# Patient Record
Sex: Female | Born: 1970
Health system: Southern US, Community
[De-identification: ages and names within clinical notes are randomized; demographics above are authoritative.]

## PROBLEM LIST (undated history)

## (undated) DIAGNOSIS — F3281 Premenstrual dysphoric disorder: Secondary | ICD-10-CM

## (undated) DIAGNOSIS — R922 Inconclusive mammogram: Secondary | ICD-10-CM

## (undated) DIAGNOSIS — R9230 Dense breasts, unspecified: Secondary | ICD-10-CM

## (undated) HISTORY — DX: Dense breasts, unspecified: R92.30

## (undated) HISTORY — DX: Premenstrual dysphoric disorder: F32.81

## (undated) HISTORY — DX: Inconclusive mammogram: R92.2

---

## 1999-06-15 ENCOUNTER — Other Ambulatory Visit: Admission: RE | Admit: 1999-06-15 | Discharge: 1999-06-15 | Payer: Self-pay | Admitting: Obstetrics & Gynecology

## 2000-01-21 ENCOUNTER — Inpatient Hospital Stay (HOSPITAL_COMMUNITY): Admission: AD | Admit: 2000-01-21 | Discharge: 2000-01-26 | Payer: Self-pay | Admitting: Obstetrics and Gynecology

## 2000-02-10 ENCOUNTER — Encounter: Admission: RE | Admit: 2000-02-10 | Discharge: 2000-02-14 | Payer: Self-pay | Admitting: Obstetrics and Gynecology

## 2000-07-31 ENCOUNTER — Other Ambulatory Visit: Admission: RE | Admit: 2000-07-31 | Discharge: 2000-07-31 | Payer: Self-pay | Admitting: Obstetrics and Gynecology

## 2000-09-20 ENCOUNTER — Emergency Department (HOSPITAL_COMMUNITY): Admission: EM | Admit: 2000-09-20 | Discharge: 2000-09-21 | Payer: Self-pay | Admitting: *Deleted

## 2001-12-02 ENCOUNTER — Other Ambulatory Visit: Admission: RE | Admit: 2001-12-02 | Discharge: 2001-12-02 | Payer: Self-pay | Admitting: Obstetrics and Gynecology

## 2002-07-06 ENCOUNTER — Inpatient Hospital Stay (HOSPITAL_COMMUNITY): Admission: AD | Admit: 2002-07-06 | Discharge: 2002-07-10 | Payer: Self-pay | Admitting: Obstetrics and Gynecology

## 2002-12-07 ENCOUNTER — Other Ambulatory Visit: Admission: RE | Admit: 2002-12-07 | Discharge: 2002-12-07 | Payer: Self-pay | Admitting: Obstetrics and Gynecology

## 2003-12-28 ENCOUNTER — Other Ambulatory Visit: Admission: RE | Admit: 2003-12-28 | Discharge: 2003-12-28 | Payer: Self-pay | Admitting: Obstetrics and Gynecology

## 2004-11-11 ENCOUNTER — Emergency Department (HOSPITAL_COMMUNITY): Admission: EM | Admit: 2004-11-11 | Discharge: 2004-11-11 | Payer: Self-pay | Admitting: Family Medicine

## 2005-01-30 ENCOUNTER — Other Ambulatory Visit: Admission: RE | Admit: 2005-01-30 | Discharge: 2005-01-30 | Payer: Self-pay | Admitting: Obstetrics and Gynecology

## 2010-02-03 ENCOUNTER — Ambulatory Visit (HOSPITAL_COMMUNITY)
Admission: RE | Admit: 2010-02-03 | Discharge: 2010-02-03 | Payer: Self-pay | Source: Home / Self Care | Attending: Obstetrics and Gynecology | Admitting: Obstetrics and Gynecology

## 2011-03-01 ENCOUNTER — Other Ambulatory Visit: Payer: Self-pay | Admitting: Obstetrics and Gynecology

## 2011-03-01 DIAGNOSIS — Z1231 Encounter for screening mammogram for malignant neoplasm of breast: Secondary | ICD-10-CM

## 2011-03-26 ENCOUNTER — Ambulatory Visit (HOSPITAL_COMMUNITY)
Admission: RE | Admit: 2011-03-26 | Discharge: 2011-03-26 | Disposition: A | Discharge status: Home / Self Care | Payer: 59 | Source: Ambulatory Visit | Attending: Obstetrics and Gynecology | Admitting: Obstetrics and Gynecology

## 2011-03-26 DIAGNOSIS — Z1231 Encounter for screening mammogram for malignant neoplasm of breast: Secondary | ICD-10-CM | POA: Insufficient documentation

## 2011-03-27 ENCOUNTER — Ambulatory Visit: Payer: Self-pay | Admitting: Obstetrics and Gynecology

## 2011-03-29 ENCOUNTER — Ambulatory Visit: Payer: 59 | Admitting: Obstetrics and Gynecology

## 2011-04-10 ENCOUNTER — Other Ambulatory Visit: Payer: Self-pay | Admitting: Obstetrics and Gynecology

## 2011-04-10 DIAGNOSIS — R922 Inconclusive mammogram: Secondary | ICD-10-CM

## 2011-04-12 ENCOUNTER — Other Ambulatory Visit: Payer: 59

## 2011-04-25 ENCOUNTER — Other Ambulatory Visit: Payer: Self-pay | Admitting: Obstetrics and Gynecology

## 2011-04-25 ENCOUNTER — Ambulatory Visit
Admission: RE | Admit: 2011-04-25 | Discharge: 2011-04-25 | Disposition: A | Discharge status: Home / Self Care | Payer: 59 | Source: Ambulatory Visit | Attending: Obstetrics and Gynecology | Admitting: Obstetrics and Gynecology

## 2011-04-25 DIAGNOSIS — R922 Inconclusive mammogram: Secondary | ICD-10-CM

## 2011-05-09 ENCOUNTER — Ambulatory Visit
Admission: RE | Admit: 2011-05-09 | Discharge: 2011-05-09 | Disposition: A | Discharge status: Home / Self Care | Payer: 59 | Source: Ambulatory Visit | Attending: Obstetrics and Gynecology | Admitting: Obstetrics and Gynecology

## 2011-05-09 ENCOUNTER — Inpatient Hospital Stay: Admission: RE | Admit: 2011-05-09 | Payer: 59 | Source: Ambulatory Visit

## 2011-05-09 ENCOUNTER — Other Ambulatory Visit: Payer: Self-pay | Admitting: Obstetrics and Gynecology

## 2011-05-09 DIAGNOSIS — R922 Inconclusive mammogram: Secondary | ICD-10-CM

## 2011-05-09 HISTORY — PX: BREAST BIOPSY: SHX20

## 2012-03-05 ENCOUNTER — Other Ambulatory Visit: Payer: Self-pay

## 2012-03-05 DIAGNOSIS — Z1231 Encounter for screening mammogram for malignant neoplasm of breast: Secondary | ICD-10-CM

## 2012-03-17 ENCOUNTER — Other Ambulatory Visit (HOSPITAL_COMMUNITY): Payer: Self-pay | Admitting: Obstetrics and Gynecology

## 2012-03-18 ENCOUNTER — Telehealth: Payer: Self-pay | Admitting: Obstetrics and Gynecology

## 2012-03-18 MED ORDER — DESOGESTREL-ETHINYL ESTRADIOL 0.15-30 MG-MCG PO TABS: 1.0000 | ORAL_TABLET | Freq: Every day | ORAL | Status: DC

## 2012-03-18 NOTE — Telephone Encounter (Signed)
VM from pt. requesting RF OCP. Has scheduled appt. [facility]. Pt [id number]

## 2012-03-18 NOTE — Telephone Encounter (Signed)
Pt scheduled for apt with [facility] 03/20/2012

## 2012-03-20 ENCOUNTER — Encounter: Payer: Self-pay | Admitting: Obstetrics and Gynecology

## 2012-03-20 ENCOUNTER — Ambulatory Visit: Payer: 59 | Admitting: Obstetrics and Gynecology

## 2012-03-20 VITALS — BP 114/80 | HR 82 | Ht 68.0 in | Wt 162.0 lb

## 2012-03-20 DIAGNOSIS — F3281 Premenstrual dysphoric disorder: Secondary | ICD-10-CM | POA: Insufficient documentation

## 2012-03-20 DIAGNOSIS — N912 Amenorrhea, unspecified: Secondary | ICD-10-CM

## 2012-03-20 DIAGNOSIS — R922 Inconclusive mammogram: Secondary | ICD-10-CM

## 2012-03-20 DIAGNOSIS — Z01419 Encounter for gynecological examination (general) (routine) without abnormal findings: Secondary | ICD-10-CM

## 2012-03-20 LAB — POCT URINE PREGNANCY: Preg Test, Ur: NEGATIVE

## 2012-03-20 MED ORDER — BUPROPION HCL ER (XL) 150 MG PO TB24: 150.0000 mg | ORAL_TABLET | Freq: Two times a day (BID) | ORAL | Status: AC

## 2012-03-20 MED ORDER — DESOGESTREL-ETHINYL ESTRADIOL 0.15-30 MG-MCG PO TABS: 1.0000 | ORAL_TABLET | Freq: Every day | ORAL | Status: AC

## 2012-03-20 NOTE — Progress Notes (Signed)
Subjective:  Last Pap: 03/20/10, next pap due 2015 per last note WNL: Yes Regular Periods:no Contraception: pill   Monthly Breast exam:no Tetanus<49yrs:yes Nl.Bladder Function:yes Daily BMs:no Healthy Diet:no Calcium:no Mammogram:yes Date of Mammogram: 02/2011, pt is scheduled for 04/16/12 Exercise:no Have often Exercise: n/a Seatbelt: yes Abuse at home: no Stressful work:yes Sigmoid-colonoscopy: n/a Bone Density: No PCP: Dr. Gweneth Dimitri Change in PMH: no changes  Change in FMH:no changes    Brooke Jackson is a 42 y.o. female, G2P1, who presents for an annual exam.     History   Social History  . Marital Status: Married    Spouse Name: N/A    Number of Children: N/A  . Years of Education: N/A   Social History Main Topics  . Smoking status: Never Smoker   . Smokeless tobacco: None  . Alcohol Use: No  . Drug Use: No  . Sexually Active: Yes    Birth Control/ Protection: Pill     Comment: reclipsen   Other Topics Concern  . None   Social History Narrative  . None    Menstrual cycle:   LMP: No LMP recorded. Patient is not currently having periods (Reason: Oral contraceptives).           Cycle: Improved headaches since no menses, even on 3 days of no bcps every 3 mos.  Still gets good relief of mood sx with Wellbutrin.  The following portions of the patient's history were reviewed and updated as appropriate: allergies, current medications, past family history, past medical history, past social history, past surgical history and problem list.  Review of Systems Pertinent items are noted in HPI. Breast:Negative for breast lump,nipple discharge or nipple retraction Gastrointestinal: Negative for abdominal pain, change in bowel habits or rectal bleeding Urinary:negative   Objective:    BP 114/80  Pulse 82  Ht 5\' 8"  (1.727 m)  Wt 162 lb (73.483 kg)  BMI 24.64 kg/m2    Weight:  Wt Readings from Last 1 Encounters:  03/20/12 162 lb (73.483 kg)          BMI: Body  mass index is 24.64 kg/(m^2).  General Appearance: Alert, appropriate appearance for age. No acute distress HEENT: Grossly normal Neck / Thyroid: Supple, no masses, nodes or enlargement Lungs: clear to auscultation bilaterally Back: No CVA tenderness Breast Exam: No masses or nodes.No dimpling, nipple retraction or discharge. Cardiovascular: Regular rate and rhythm. S1, S2, no murmur Gastrointestinal: Soft, non-tender, no masses or organomegaly Pelvic Exam: Vulva and vagina appear normal. Bimanual exam reveals normal uterus and adnexa. Rectovaginal: normal rectal, no masses Lymphatic Exam: Non-palpable nodes in neck, clavicular, axillary, or inguinal regions Skin: no rash or abnormalities Neurologic: Normal gait and speech, no tremor  Psychiatric: Alert and oriented, appropriate affect.   Wet Prep:not applicable Urinalysis:not applicable UPT: Not done   Assessment:    Normal gyn exam  Good relief from extended cycle BCPs Dense breases   Plan:    Mammogram 3D pap smear done, next pap due 2017 return annually or prn Contraception:oral contraceptives (estrogen/progesterone) extended cycle Continue wellbutrin per pt request  Dierdre Forth  MD

## 2012-04-14 ENCOUNTER — Encounter: Payer: Self-pay | Admitting: Obstetrics and Gynecology

## 2012-04-14 ENCOUNTER — Ambulatory Visit
Admission: RE | Admit: 2012-04-14 | Discharge: 2012-04-14 | Disposition: A | Discharge status: Home / Self Care | Payer: 59 | Source: Ambulatory Visit

## 2012-04-14 DIAGNOSIS — Z1231 Encounter for screening mammogram for malignant neoplasm of breast: Secondary | ICD-10-CM

## 2012-11-13 ENCOUNTER — Other Ambulatory Visit: Payer: Self-pay

## 2013-03-20 ENCOUNTER — Other Ambulatory Visit: Payer: Self-pay

## 2013-03-20 DIAGNOSIS — Z1231 Encounter for screening mammogram for malignant neoplasm of breast: Secondary | ICD-10-CM

## 2013-04-21 ENCOUNTER — Ambulatory Visit
Admission: RE | Admit: 2013-04-21 | Discharge: 2013-04-21 | Disposition: A | Discharge status: Home / Self Care | Payer: 59 | Source: Ambulatory Visit

## 2013-04-21 DIAGNOSIS — Z1231 Encounter for screening mammogram for malignant neoplasm of breast: Secondary | ICD-10-CM

## 2013-04-23 ENCOUNTER — Other Ambulatory Visit: Payer: Self-pay | Admitting: Obstetrics and Gynecology

## 2013-04-23 DIAGNOSIS — R928 Other abnormal and inconclusive findings on diagnostic imaging of breast: Secondary | ICD-10-CM

## 2013-05-05 ENCOUNTER — Ambulatory Visit
Admission: RE | Admit: 2013-05-05 | Discharge: 2013-05-05 | Disposition: A | Discharge status: Home / Self Care | Payer: 59 | Source: Ambulatory Visit | Attending: Obstetrics and Gynecology | Admitting: Obstetrics and Gynecology

## 2013-05-05 DIAGNOSIS — R928 Other abnormal and inconclusive findings on diagnostic imaging of breast: Secondary | ICD-10-CM

## 2013-10-05 ENCOUNTER — Other Ambulatory Visit: Payer: Self-pay | Admitting: Obstetrics and Gynecology

## 2013-10-05 DIAGNOSIS — N63 Unspecified lump in unspecified breast: Secondary | ICD-10-CM

## 2013-10-23 ENCOUNTER — Other Ambulatory Visit: Payer: Self-pay

## 2013-10-26 ENCOUNTER — Ambulatory Visit
Admission: RE | Admit: 2013-10-26 | Discharge: 2013-10-26 | Disposition: A | Discharge status: Home / Self Care | Payer: 59 | Source: Ambulatory Visit | Attending: Obstetrics and Gynecology | Admitting: Obstetrics and Gynecology

## 2013-10-26 DIAGNOSIS — N63 Unspecified lump in unspecified breast: Secondary | ICD-10-CM

## 2013-11-09 ENCOUNTER — Encounter: Payer: Self-pay | Admitting: Obstetrics and Gynecology

## 2014-03-29 ENCOUNTER — Other Ambulatory Visit: Payer: Self-pay

## 2014-03-29 DIAGNOSIS — Z1231 Encounter for screening mammogram for malignant neoplasm of breast: Secondary | ICD-10-CM

## 2014-05-03 ENCOUNTER — Ambulatory Visit
Admission: RE | Admit: 2014-05-03 | Discharge: 2014-05-03 | Disposition: A | Discharge status: Home / Self Care | Payer: 59 | Source: Ambulatory Visit

## 2014-05-03 DIAGNOSIS — Z1231 Encounter for screening mammogram for malignant neoplasm of breast: Secondary | ICD-10-CM

## 2015-02-18 MED FILL — BUPROPION HCL XL 150 MG TAB: 150 | 90 days supply | Qty: 180 | Fill #3

## 2015-03-08 MED FILL — EMOQUETTE 28 DAY TABLET: 0.15-30 | 84 days supply | Qty: 112 | Fill #3

## 2015-03-31 ENCOUNTER — Other Ambulatory Visit: Payer: Self-pay

## 2015-03-31 DIAGNOSIS — Z3040 Encounter for surveillance of contraceptives, unspecified: Secondary | ICD-10-CM | POA: Diagnosis not present

## 2015-03-31 DIAGNOSIS — Z6829 Body mass index (BMI) 29.0-29.9, adult: Secondary | ICD-10-CM | POA: Diagnosis not present

## 2015-03-31 DIAGNOSIS — Z01419 Encounter for gynecological examination (general) (routine) without abnormal findings: Secondary | ICD-10-CM | POA: Diagnosis not present

## 2015-03-31 DIAGNOSIS — N943 Premenstrual tension syndrome: Secondary | ICD-10-CM | POA: Diagnosis not present

## 2015-03-31 DIAGNOSIS — Z1231 Encounter for screening mammogram for malignant neoplasm of breast: Secondary | ICD-10-CM

## 2015-04-14 DIAGNOSIS — N943 Premenstrual tension syndrome: Secondary | ICD-10-CM | POA: Diagnosis not present

## 2015-04-14 DIAGNOSIS — Z13220 Encounter for screening for lipoid disorders: Secondary | ICD-10-CM | POA: Diagnosis not present

## 2015-04-14 DIAGNOSIS — R51 Headache: Secondary | ICD-10-CM | POA: Diagnosis not present

## 2015-04-14 DIAGNOSIS — R1013 Epigastric pain: Secondary | ICD-10-CM | POA: Diagnosis not present

## 2015-04-14 DIAGNOSIS — Z0000 Encounter for general adult medical examination without abnormal findings: Secondary | ICD-10-CM | POA: Diagnosis not present

## 2015-05-12 ENCOUNTER — Ambulatory Visit
Admission: RE | Admit: 2015-05-12 | Discharge: 2015-05-12 | Disposition: A | Discharge status: Home / Self Care | Payer: 59 | Source: Ambulatory Visit

## 2015-05-12 DIAGNOSIS — Z1231 Encounter for screening mammogram for malignant neoplasm of breast: Secondary | ICD-10-CM

## 2015-06-01 MED FILL — JULEBER 0.15-30 MG-MCG TABS: 0.15-30 | 84 days supply | Qty: 112 | Fill #0

## 2015-07-18 MED FILL — BUPROPION HCL XL 150 MG TAB: 150 | 90 days supply | Qty: 180 | Fill #0

## 2015-08-19 MED FILL — EMOQUETTE 28 DAY TABLET: 0.15-30 | 84 days supply | Qty: 112 | Fill #1

## 2015-10-31 MED FILL — BUPROPION HCL XL 150 MG TAB: 150 | 90 days supply | Qty: 180 | Fill #1

## 2015-11-22 MED FILL — EMOQUETTE 28 DAY TABLET: 0.15-30 | 84 days supply | Qty: 112 | Fill #2

## 2016-02-07 MED FILL — BUPROPION HCL XL 150 MG TAB: 150 | 90 days supply | Qty: 180 | Fill #2

## 2016-02-07 MED FILL — EMOQUETTE 28 DAY TABLET: 0.15-30 | 84 days supply | Qty: 112 | Fill #3

## 2016-04-17 DIAGNOSIS — H905 Unspecified sensorineural hearing loss: Secondary | ICD-10-CM | POA: Diagnosis not present

## 2016-04-17 DIAGNOSIS — Z0000 Encounter for general adult medical examination without abnormal findings: Secondary | ICD-10-CM | POA: Diagnosis not present

## 2016-04-17 DIAGNOSIS — N943 Premenstrual tension syndrome: Secondary | ICD-10-CM | POA: Diagnosis not present

## 2016-04-17 DIAGNOSIS — R51 Headache: Secondary | ICD-10-CM | POA: Diagnosis not present

## 2016-04-17 DIAGNOSIS — R350 Frequency of micturition: Secondary | ICD-10-CM | POA: Diagnosis not present

## 2016-05-10 DIAGNOSIS — Z1231 Encounter for screening mammogram for malignant neoplasm of breast: Secondary | ICD-10-CM | POA: Diagnosis not present

## 2016-05-10 DIAGNOSIS — Z01419 Encounter for gynecological examination (general) (routine) without abnormal findings: Secondary | ICD-10-CM | POA: Diagnosis not present

## 2016-05-10 DIAGNOSIS — J343 Hypertrophy of nasal turbinates: Secondary | ICD-10-CM | POA: Diagnosis not present

## 2016-05-10 DIAGNOSIS — H9313 Tinnitus, bilateral: Secondary | ICD-10-CM | POA: Diagnosis not present

## 2016-05-10 DIAGNOSIS — N943 Premenstrual tension syndrome: Secondary | ICD-10-CM | POA: Diagnosis not present

## 2016-05-10 DIAGNOSIS — H8110 Benign paroxysmal vertigo, unspecified ear: Secondary | ICD-10-CM | POA: Diagnosis not present

## 2016-05-10 DIAGNOSIS — Z6825 Body mass index (BMI) 25.0-25.9, adult: Secondary | ICD-10-CM | POA: Diagnosis not present

## 2016-05-10 DIAGNOSIS — H903 Sensorineural hearing loss, bilateral: Secondary | ICD-10-CM | POA: Diagnosis not present

## 2016-05-10 MED FILL — BUPROPION HCL XL 150 MG TAB: 150 | 90 days supply | Qty: 180 | Fill #0

## 2016-05-10 MED FILL — EMOQUETTE 28 DAY TABLET: 0.15-30 | 84 days supply | Qty: 112 | Fill #0

## 2016-07-06 MED FILL — CIPROFLOXACIN HCL 500 MG TA: 500 | 7 days supply | Qty: 14 | Fill #0

## 2016-08-07 MED FILL — BUPROPION HCL XL 150 MG TAB: 150 | 90 days supply | Qty: 180 | Fill #1

## 2016-08-07 MED FILL — JULEBER 0.15-30 MG-MCG TABS: 0.15-30 | 84 days supply | Qty: 112 | Fill #1

## 2016-11-02 MED FILL — BUPROPION HCL XL 150 MG TAB: 150 | 90 days supply | Qty: 180 | Fill #2

## 2016-11-02 MED FILL — JULEBER 0.15-30 MG-MCG TABS: 0.15-30 | 84 days supply | Qty: 112 | Fill #2

## 2017-02-04 MED FILL — JULEBER 0.15-30 MG-MCG TABS: 0.15-30 | 84 days supply | Qty: 112 | Fill #3

## 2017-02-04 MED FILL — buPROPion HCL ER (XL) 150 M: 150 | 90 days supply | Qty: 180 | Fill #3

## 2017-05-01 MED FILL — JULEBER 0.15-30 MG-MCG TABS: 0.15-30 | 84 days supply | Qty: 112 | Fill #0

## 2017-05-01 MED FILL — BUPROPION HCL XL 150 MG TAB: 150 | 90 days supply | Qty: 180 | Fill #0

## 2017-05-09 DIAGNOSIS — R3915 Urgency of urination: Secondary | ICD-10-CM | POA: Diagnosis not present

## 2017-05-09 DIAGNOSIS — Z0000 Encounter for general adult medical examination without abnormal findings: Secondary | ICD-10-CM | POA: Diagnosis not present

## 2017-05-09 DIAGNOSIS — Z6825 Body mass index (BMI) 25.0-25.9, adult: Secondary | ICD-10-CM | POA: Diagnosis not present

## 2017-05-09 DIAGNOSIS — Z1231 Encounter for screening mammogram for malignant neoplasm of breast: Secondary | ICD-10-CM | POA: Diagnosis not present

## 2017-05-09 DIAGNOSIS — R51 Headache: Secondary | ICD-10-CM | POA: Diagnosis not present

## 2017-05-09 DIAGNOSIS — Z3040 Encounter for surveillance of contraceptives, unspecified: Secondary | ICD-10-CM | POA: Diagnosis not present

## 2017-05-09 DIAGNOSIS — N943 Premenstrual tension syndrome: Secondary | ICD-10-CM | POA: Diagnosis not present

## 2017-05-09 DIAGNOSIS — H905 Unspecified sensorineural hearing loss: Secondary | ICD-10-CM | POA: Diagnosis not present

## 2017-05-09 DIAGNOSIS — Z01419 Encounter for gynecological examination (general) (routine) without abnormal findings: Secondary | ICD-10-CM | POA: Diagnosis not present

## 2017-07-25 MED FILL — JULEBER 0.15-30 MG-MCG TABS: 0.15-30 | 84 days supply | Qty: 112 | Fill #0

## 2017-07-25 MED FILL — buPROPion HCL ER (XL) 150 M: 150 | 90 days supply | Qty: 180 | Fill #0

## 2017-10-23 MED FILL — JULEBER 0.15-30 MG-MCG TABS: 0.15-30 | 84 days supply | Qty: 112 | Fill #0

## 2017-10-23 MED FILL — buPROPion HCL ER (XL) 150 M: 150 | 90 days supply | Qty: 180 | Fill #0

## 2017-12-12 DIAGNOSIS — H524 Presbyopia: Secondary | ICD-10-CM | POA: Diagnosis not present

## 2018-01-13 MED FILL — ENSKYCE 0.15-30 MG-MCG TABS: 0.15-30 | 84 days supply | Qty: 112 | Fill #1

## 2018-01-13 MED FILL — buPROPion HCL ER (XL) 150 M: 150 | 90 days supply | Qty: 180 | Fill #1

## 2018-03-25 MED FILL — buPROPion HCL ER (XL) 150 M: 150 | 90 days supply | Qty: 180 | Fill #2

## 2018-03-25 MED FILL — ENSKYCE 0.15-30 MG-MCG TABS: 0.15-30 | 84 days supply | Qty: 112 | Fill #2

## 2018-06-05 DIAGNOSIS — N912 Amenorrhea, unspecified: Secondary | ICD-10-CM | POA: Diagnosis not present

## 2018-06-05 DIAGNOSIS — Z309 Encounter for contraceptive management, unspecified: Secondary | ICD-10-CM | POA: Diagnosis not present

## 2018-06-05 DIAGNOSIS — Z124 Encounter for screening for malignant neoplasm of cervix: Secondary | ICD-10-CM | POA: Diagnosis not present

## 2018-06-05 DIAGNOSIS — Z01419 Encounter for gynecological examination (general) (routine) without abnormal findings: Secondary | ICD-10-CM | POA: Diagnosis not present

## 2018-06-05 DIAGNOSIS — Z1231 Encounter for screening mammogram for malignant neoplasm of breast: Secondary | ICD-10-CM | POA: Diagnosis not present

## 2018-06-05 MED FILL — SLYND 4 MG TABS: 4 | 84 days supply | Qty: 84 | Fill #0

## 2018-07-29 MED FILL — buPROPion HCL ER (XL) 150 M: 150 | 90 days supply | Qty: 180 | Fill #3

## 2018-09-29 DIAGNOSIS — Z0000 Encounter for general adult medical examination without abnormal findings: Secondary | ICD-10-CM | POA: Diagnosis not present

## 2018-09-29 DIAGNOSIS — Z131 Encounter for screening for diabetes mellitus: Secondary | ICD-10-CM | POA: Diagnosis not present

## 2018-09-29 DIAGNOSIS — Z13220 Encounter for screening for lipoid disorders: Secondary | ICD-10-CM | POA: Diagnosis not present

## 2018-09-29 DIAGNOSIS — R51 Headache: Secondary | ICD-10-CM | POA: Diagnosis not present

## 2018-09-29 DIAGNOSIS — N943 Premenstrual tension syndrome: Secondary | ICD-10-CM | POA: Diagnosis not present

## 2018-11-04 MED FILL — buPROPion HCL ER (XL) 150 M: 150 | 90 days supply | Qty: 180 | Fill #0

## 2018-11-12 MED FILL — SLYND 4 MG TABS: 4 | 84 days supply | Qty: 84 | Fill #1

## 2019-02-04 MED FILL — buPROPion HCL ER (XL) 150 M: 150 | 90 days supply | Qty: 180 | Fill #1

## 2019-02-04 MED FILL — SLYND 4 MG TABS: 4 | 84 days supply | Qty: 84 | Fill #2

## 2019-04-21 MED FILL — buPROPion HCL ER (XL) 150 M: 150 | 90 days supply | Qty: 180 | Fill #2

## 2019-04-21 MED FILL — SLYND 4 MG TABS: 4 | 84 days supply | Qty: 84 | Fill #3

## 2019-06-19 ENCOUNTER — Other Ambulatory Visit (HOSPITAL_COMMUNITY): Payer: Self-pay | Admitting: Obstetrics and Gynecology

## 2019-06-19 DIAGNOSIS — R3915 Urgency of urination: Secondary | ICD-10-CM | POA: Diagnosis not present

## 2019-06-19 DIAGNOSIS — Z1211 Encounter for screening for malignant neoplasm of colon: Secondary | ICD-10-CM | POA: Diagnosis not present

## 2019-06-19 DIAGNOSIS — Z1231 Encounter for screening mammogram for malignant neoplasm of breast: Secondary | ICD-10-CM | POA: Diagnosis not present

## 2019-06-19 DIAGNOSIS — Z01419 Encounter for gynecological examination (general) (routine) without abnormal findings: Secondary | ICD-10-CM | POA: Diagnosis not present

## 2019-06-19 DIAGNOSIS — Z124 Encounter for screening for malignant neoplasm of cervix: Secondary | ICD-10-CM | POA: Diagnosis not present

## 2019-06-19 DIAGNOSIS — Z1239 Encounter for other screening for malignant neoplasm of breast: Secondary | ICD-10-CM | POA: Diagnosis not present

## 2019-06-19 DIAGNOSIS — Z13228 Encounter for screening for other metabolic disorders: Secondary | ICD-10-CM | POA: Diagnosis not present

## 2019-06-19 DIAGNOSIS — Z309 Encounter for contraceptive management, unspecified: Secondary | ICD-10-CM | POA: Diagnosis not present

## 2019-06-19 DIAGNOSIS — F3281 Premenstrual dysphoric disorder: Secondary | ICD-10-CM | POA: Diagnosis not present

## 2019-06-22 MED FILL — VIT D3-50 50,000 UNITS CAPS: 1.25 MG | 56 days supply | Qty: 8 | Fill #0

## 2019-07-02 DIAGNOSIS — H524 Presbyopia: Secondary | ICD-10-CM | POA: Diagnosis not present

## 2019-07-03 MED FILL — SLYND 4 MG TABS: 4 | 84 days supply | Qty: 84 | Fill #0

## 2019-07-06 DIAGNOSIS — R3915 Urgency of urination: Secondary | ICD-10-CM | POA: Diagnosis not present

## 2019-07-06 MED FILL — buPROPion HCL ER (XL) 150 M: 150 | 90 days supply | Qty: 180 | Fill #3

## 2019-07-30 DIAGNOSIS — Z1211 Encounter for screening for malignant neoplasm of colon: Secondary | ICD-10-CM | POA: Diagnosis not present

## 2019-07-30 DIAGNOSIS — K5900 Constipation, unspecified: Secondary | ICD-10-CM | POA: Diagnosis not present

## 2019-07-30 DIAGNOSIS — K219 Gastro-esophageal reflux disease without esophagitis: Secondary | ICD-10-CM | POA: Diagnosis not present

## 2019-07-30 DIAGNOSIS — R140 Abdominal distension (gaseous): Secondary | ICD-10-CM | POA: Diagnosis not present

## 2019-07-30 MED FILL — CLENPIQ 10-3.5-12 MG-GM -GM: 10-3.5-12 M | 1 days supply | Qty: 320 | Fill #0

## 2019-09-01 DIAGNOSIS — E559 Vitamin D deficiency, unspecified: Secondary | ICD-10-CM | POA: Diagnosis not present

## 2019-09-07 DIAGNOSIS — Z1211 Encounter for screening for malignant neoplasm of colon: Secondary | ICD-10-CM | POA: Diagnosis not present

## 2019-09-08 DIAGNOSIS — R3915 Urgency of urination: Secondary | ICD-10-CM | POA: Diagnosis not present

## 2019-09-16 MED FILL — SLYND 4 MG TABS: 4 | 84 days supply | Qty: 84 | Fill #0

## 2019-09-24 DIAGNOSIS — Z0000 Encounter for general adult medical examination without abnormal findings: Secondary | ICD-10-CM | POA: Diagnosis not present

## 2019-09-24 DIAGNOSIS — N943 Premenstrual tension syndrome: Secondary | ICD-10-CM | POA: Diagnosis not present

## 2019-10-21 MED FILL — buPROPion HCL ER (XL) 150 M: 150 | 90 days supply | Qty: 180 | Fill #0

## 2019-11-25 MED FILL — SLYND 4 MG TABS: 4 | 84 days supply | Qty: 84 | Fill #1

## 2020-01-25 MED FILL — buPROPion HCL ER (XL) 150 M: 150 | 90 days supply | Qty: 180 | Fill #1

## 2020-02-04 MED FILL — SLYND 4 MG TABS: 4 | 84 days supply | Qty: 84 | Fill #2

## 2020-03-18 DIAGNOSIS — M25572 Pain in left ankle and joints of left foot: Secondary | ICD-10-CM | POA: Diagnosis not present

## 2020-04-14 MED FILL — Drospirenone Tab 4 MG: ORAL | 84 days supply | Qty: 84 | Fill #0 | Status: AC

## 2020-04-14 MED FILL — Bupropion HCl Tab ER 24HR 150 MG: ORAL | 90 days supply | Qty: 180 | Fill #0 | Status: AC

## 2020-04-15 ENCOUNTER — Other Ambulatory Visit (HOSPITAL_COMMUNITY): Payer: Self-pay

## 2020-04-18 ENCOUNTER — Other Ambulatory Visit (HOSPITAL_COMMUNITY): Payer: Self-pay

## 2020-04-28 ENCOUNTER — Other Ambulatory Visit (HOSPITAL_COMMUNITY): Payer: Self-pay

## 2020-06-21 ENCOUNTER — Other Ambulatory Visit: Payer: Self-pay | Admitting: Obstetrics and Gynecology

## 2020-06-21 DIAGNOSIS — Z1231 Encounter for screening mammogram for malignant neoplasm of breast: Secondary | ICD-10-CM

## 2020-06-24 ENCOUNTER — Ambulatory Visit
Admission: RE | Admit: 2020-06-24 | Discharge: 2020-06-24 | Disposition: A | Discharge status: Home / Self Care | Payer: 59 | Source: Ambulatory Visit | Attending: Obstetrics and Gynecology | Admitting: Obstetrics and Gynecology

## 2020-06-24 ENCOUNTER — Other Ambulatory Visit: Payer: Self-pay

## 2020-06-24 DIAGNOSIS — Z1231 Encounter for screening mammogram for malignant neoplasm of breast: Secondary | ICD-10-CM | POA: Diagnosis not present

## 2020-06-27 ENCOUNTER — Other Ambulatory Visit (HOSPITAL_COMMUNITY): Payer: Self-pay

## 2020-06-27 DIAGNOSIS — Z124 Encounter for screening for malignant neoplasm of cervix: Secondary | ICD-10-CM | POA: Diagnosis not present

## 2020-06-27 DIAGNOSIS — Z3040 Encounter for surveillance of contraceptives, unspecified: Secondary | ICD-10-CM | POA: Diagnosis not present

## 2020-06-27 DIAGNOSIS — Z01419 Encounter for gynecological examination (general) (routine) without abnormal findings: Secondary | ICD-10-CM | POA: Diagnosis not present

## 2020-06-27 DIAGNOSIS — F3281 Premenstrual dysphoric disorder: Secondary | ICD-10-CM | POA: Diagnosis not present

## 2020-06-27 MED ORDER — SLYND 4 MG PO TABS
1.0000 | ORAL_TABLET | Freq: Every day | ORAL | 4 refills | Status: AC
  Filled 2020-06-27: qty 84, 84d supply, fill #0
  Filled 2020-08-29 – 2021-04-10 (×2): qty 84, 84d supply, fill #1

## 2020-06-27 MED ORDER — BUPROPION HCL ER (XL) 150 MG PO TB24
2.0000 | ORAL_TABLET | Freq: Every day | ORAL | 4 refills | Status: AC
  Filled 2020-06-27: qty 90, 45d supply, fill #0
  Filled 2020-06-28: qty 180, 90d supply, fill #0
  Filled 2021-01-30: qty 180, 90d supply, fill #1

## 2020-06-28 ENCOUNTER — Other Ambulatory Visit (HOSPITAL_COMMUNITY): Payer: Self-pay

## 2020-06-30 ENCOUNTER — Other Ambulatory Visit (HOSPITAL_COMMUNITY): Payer: Self-pay

## 2020-08-29 ENCOUNTER — Other Ambulatory Visit (HOSPITAL_COMMUNITY): Payer: Self-pay

## 2020-08-30 ENCOUNTER — Encounter: Payer: Self-pay | Admitting: Physician Assistant

## 2020-08-30 ENCOUNTER — Telehealth: Payer: 59 | Admitting: Physician Assistant

## 2020-08-30 ENCOUNTER — Other Ambulatory Visit (HOSPITAL_COMMUNITY): Payer: Self-pay

## 2020-08-30 DIAGNOSIS — N390 Urinary tract infection, site not specified: Secondary | ICD-10-CM

## 2020-08-30 MED ORDER — CEPHALEXIN 500 MG PO CAPS
500.0000 mg | ORAL_CAPSULE | Freq: Four times a day (QID) | ORAL | 0 refills | Status: DC
  Filled 2020-08-30: qty 28, 7d supply, fill #0

## 2020-08-30 MED ORDER — CEPHALEXIN 500 MG PO CAPS
500.0000 mg | ORAL_CAPSULE | Freq: Two times a day (BID) | ORAL | 0 refills | Status: AC
  Filled 2020-08-30: qty 14, 7d supply, fill #0

## 2020-08-30 MED ORDER — SLYND 4 MG PO TABS
4.0000 mg | ORAL_TABLET | Freq: Every day | ORAL | 3 refills | Status: AC
  Filled 2020-08-30: qty 84, 72d supply, fill #0
  Filled 2020-08-30: qty 112, 84d supply, fill #0
  Filled 2020-11-09: qty 84, 72d supply, fill #1
  Filled 2021-01-30: qty 84, 72d supply, fill #2
  Filled 2021-06-21: qty 84, 72d supply, fill #3
  Filled 2021-08-27: qty 112, 84d supply, fill #4

## 2020-08-30 NOTE — Patient Instructions (Signed)
  Brooke Jackson, thank you for joining Brooke Meres, PA-C for today's virtual visit.  While this provider is not your primary care provider (PCP), if your PCP is located in our provider database this encounter information will be shared with them immediately following your visit.  Consent: (Patient) Brooke Jackson provided verbal consent for this virtual visit at the beginning of the encounter.  Current Medications:  Current Outpatient Medications:    cephALEXin (KEFLEX) 500 MG capsule, Take 1 capsule (500 mg total) by mouth 4 (four) times daily for 7 days., Disp: 28 capsule, Rfl: 0   buPROPion (WELLBUTRIN XL) 150 MG 24 hr tablet, Take 1 tablet (150 mg total) by mouth 2 (two) times daily., Disp: 60 tablet, Rfl: 12   buPROPion (WELLBUTRIN XL) 150 MG 24 hr tablet, Take 2 tablets (300 mg total) by mouth daily., Disp: 180 tablet, Rfl: 4   desogestrel-ethinyl estradiol (RECLIPSEN) 0.15-30 MG-MCG tablet, Take 1 tablet by mouth daily., Disp: 1 Package, Rfl: 12   Drospirenone (SLYND) 4 MG TABS, Take 1 tablet by mouth daily., Disp: 84 tablet, Rfl: 4   Drospirenone (SLYND) 4 MG TABS, Take 1 tablet by mouth daily, continously., Disp: 112 tablet, Rfl: 3   NAPROXEN PO, Take by mouth as needed (for headaches)., Disp: , Rfl:    Medications ordered in this encounter:  Meds ordered this encounter  Medications   cephALEXin (KEFLEX) 500 MG capsule    Sig: Take 1 capsule (500 mg total) by mouth 4 (four) times daily for 7 days.    Dispense:  28 capsule    Refill:  0    Order Specific Question:   Supervising Provider    Answer:   Hyacinth Meeker, BRIAN [[id number]]     *If you need refills on other medications prior to your next appointment, please contact your pharmacy*  Follow-Up: Call back or seek an in-person evaluation if the symptoms worsen or if the condition fails to improve as anticipated.  Other Instructions Take keflex as prescribed   If you have been instructed to have an in-person evaluation today at a  local Urgent Care facility, please use the link below. It will take you to a list of all of our available [facility], including address, phone number and hours of operation. Please do not delay care.  [facility]  If you or a family member do not have a primary care provider, use the link below to schedule a visit and establish care. When you choose a [facility] primary care physician or advanced practice provider, you gain a long-term partner in health. Find a Primary Care Provider  Learn more about [facility] in-office and virtual care options: [facility] Get Care Now

## 2020-08-30 NOTE — Progress Notes (Signed)
Ms. Brooke Jackson, Brooke Jackson are scheduled for a virtual visit with your provider today.    Just as we do with appointments in the office, we must obtain your consent to participate.  Your consent will be active for this visit and any virtual visit you may have with one of our providers in the next 365 days.    If you have a MyChart account, I can also send a copy of this consent to you electronically.  All virtual visits are billed to your insurance company just like a traditional visit in the office.  As this is a virtual visit, video technology does not allow for your provider to perform a traditional examination.  This may limit your provider's ability to fully assess your condition.  If your provider identifies any concerns that need to be evaluated in person or the need to arrange testing such as labs, EKG, etc, we will make arrangements to do so.    Although advances in technology are sophisticated, we cannot ensure that it will always work on either your end or our end.  If the connection with a video visit is poor, we may have to switch to a telephone visit.  With either a video or telephone visit, we are not always able to ensure that we have a secure connection.   I need to obtain your verbal consent now.   Are you willing to proceed with your visit today?   Brooke Jackson has provided verbal consent on 08/30/2020 for a virtual visit (video or telephone).   Karrie Meres, PA-C 08/30/2020  1:13 PM   Date:  08/30/2020   ID:  Brooke Jackson, DOB [birth year], MRN [id number]  Patient Location: Home Provider Location: Home Office   Participants: Patient and Provider for Visit and Wrap up  Method of visit: Video  Location of Patient: Home Location of Provider: Home Office Consent was obtain for visit over the video. Services rendered by provider: Visit was performed via video  A video enabled telemedicine application was used and I verified that I am speaking with the correct person using two  identifiers.  PCP:  Gweneth Dimitri, MD   Chief Complaint:  urinary frequency  History of Present Illness:    Brooke Jackson is a 50 y.o. female with history as stated below. Presents video telehealth for an acute care visit  Pt is c/o urinary frequency that started last night. She further reports hematuria, dysuria, and urinary urgency. She has had UTIs in the past and this feels somewhat similar. Denies fevers, nausea vomiting.  Past Medical, Surgical, Social History, Allergies, and Medications have been Reviewed.  Past Medical History:  Diagnosis Date   Breast density    PMDD (premenstrual dysphoric disorder)     Current Meds  Medication Sig   cephALEXin (KEFLEX) 500 MG capsule Take 1 capsule (500 mg total) by mouth 4 (four) times daily for 7 days.     Allergies:   Patient has no known allergies.   ROS See HPI for history of present illness.  Physical Exam Vitals and nursing note reviewed.  Constitutional:      General: She is not in acute distress.    Appearance: She is well-developed.  Eyes:     Conjunctiva/sclera: Conjunctivae normal.  Pulmonary:     Effort: Pulmonary effort is normal.  Neurological:     Mental Status: She is alert.             MDM: PT with uti sxs starting last night. No  systemic sxs to suggest pyelonephritis or other emergent cause of sxs. Rx for keflex given  There are no diagnoses linked to this encounter.   Time:   Today, I have spent 7 minutes with the patient with telehealth technology discussing the above problems, reviewing the chart, previous notes, medications and orders.    Tests Ordered: No orders of the defined types were placed in this encounter.   Medication Changes: Meds ordered this encounter  Medications   cephALEXin (KEFLEX) 500 MG capsule    Sig: Take 1 capsule (500 mg total) by mouth 4 (four) times daily for 7 days.    Dispense:  28 capsule    Refill:  0    Order Specific Question:   Supervising Provider     Answer:   Eber Hong [[id number]]     Disposition:  Follow up  Signed, Encarnacion Bole Manfred Shirts, PA-C  08/30/2020 1:13 PM

## 2020-09-02 ENCOUNTER — Other Ambulatory Visit (HOSPITAL_COMMUNITY): Payer: Self-pay

## 2020-09-22 DIAGNOSIS — Z13220 Encounter for screening for lipoid disorders: Secondary | ICD-10-CM | POA: Diagnosis not present

## 2020-09-22 DIAGNOSIS — Z0000 Encounter for general adult medical examination without abnormal findings: Secondary | ICD-10-CM | POA: Diagnosis not present

## 2020-11-10 ENCOUNTER — Other Ambulatory Visit (HOSPITAL_COMMUNITY): Payer: Self-pay

## 2021-01-06 DIAGNOSIS — H524 Presbyopia: Secondary | ICD-10-CM | POA: Diagnosis not present

## 2021-01-30 ENCOUNTER — Other Ambulatory Visit (HOSPITAL_COMMUNITY): Payer: Self-pay

## 2021-04-11 ENCOUNTER — Other Ambulatory Visit (HOSPITAL_COMMUNITY): Payer: Self-pay

## 2021-05-15 ENCOUNTER — Other Ambulatory Visit (HOSPITAL_COMMUNITY): Payer: Self-pay

## 2021-05-29 ENCOUNTER — Other Ambulatory Visit: Payer: Self-pay | Admitting: Obstetrics and Gynecology

## 2021-05-29 DIAGNOSIS — Z1231 Encounter for screening mammogram for malignant neoplasm of breast: Secondary | ICD-10-CM

## 2021-06-21 ENCOUNTER — Other Ambulatory Visit (HOSPITAL_COMMUNITY): Payer: Self-pay

## 2021-06-28 ENCOUNTER — Ambulatory Visit
Admission: RE | Admit: 2021-06-28 | Discharge: 2021-06-28 | Disposition: A | Discharge status: Home / Self Care | Payer: 59 | Source: Ambulatory Visit | Attending: Obstetrics and Gynecology | Admitting: Obstetrics and Gynecology

## 2021-06-28 DIAGNOSIS — Z1231 Encounter for screening mammogram for malignant neoplasm of breast: Secondary | ICD-10-CM | POA: Diagnosis not present

## 2021-06-29 ENCOUNTER — Other Ambulatory Visit (HOSPITAL_COMMUNITY): Payer: Self-pay

## 2021-06-29 DIAGNOSIS — R3915 Urgency of urination: Secondary | ICD-10-CM | POA: Diagnosis not present

## 2021-06-29 DIAGNOSIS — Z124 Encounter for screening for malignant neoplasm of cervix: Secondary | ICD-10-CM | POA: Diagnosis not present

## 2021-06-29 DIAGNOSIS — Z01419 Encounter for gynecological examination (general) (routine) without abnormal findings: Secondary | ICD-10-CM | POA: Diagnosis not present

## 2021-06-29 DIAGNOSIS — Z3040 Encounter for surveillance of contraceptives, unspecified: Secondary | ICD-10-CM | POA: Diagnosis not present

## 2021-06-29 DIAGNOSIS — F3281 Premenstrual dysphoric disorder: Secondary | ICD-10-CM | POA: Diagnosis not present

## 2021-06-29 MED ORDER — SLYND 4 MG PO TABS
1.0000 | ORAL_TABLET | Freq: Every day | ORAL | 3 refills | Status: DC
  Filled 2021-06-29: qty 112, 112d supply, fill #0
  Filled 2021-11-09: qty 112, 84d supply, fill #0
  Filled 2022-02-01: qty 112, 84d supply, fill #1
  Filled 2022-05-04 (×2): qty 112, 84d supply, fill #2

## 2021-06-29 MED ORDER — BUPROPION HCL ER (XL) 150 MG PO TB24
300.0000 mg | ORAL_TABLET | Freq: Every day | ORAL | 3 refills | Status: DC
  Filled 2021-06-29: qty 180, 90d supply, fill #0
  Filled 2021-11-09: qty 180, 90d supply, fill #1
  Filled 2022-02-01: qty 180, 90d supply, fill #2
  Filled 2022-05-04 (×2): qty 180, 90d supply, fill #3

## 2021-07-21 ENCOUNTER — Other Ambulatory Visit (HOSPITAL_COMMUNITY): Payer: Self-pay

## 2021-07-27 DIAGNOSIS — Z801 Family history of malignant neoplasm of trachea, bronchus and lung: Secondary | ICD-10-CM | POA: Diagnosis not present

## 2021-07-27 DIAGNOSIS — Z800 Family history of malignant neoplasm of digestive organs: Secondary | ICD-10-CM | POA: Diagnosis not present

## 2021-07-27 DIAGNOSIS — Z803 Family history of malignant neoplasm of breast: Secondary | ICD-10-CM | POA: Diagnosis not present

## 2021-08-01 DIAGNOSIS — Z800 Family history of malignant neoplasm of digestive organs: Secondary | ICD-10-CM | POA: Diagnosis not present

## 2021-08-01 DIAGNOSIS — Z803 Family history of malignant neoplasm of breast: Secondary | ICD-10-CM | POA: Diagnosis not present

## 2021-08-28 ENCOUNTER — Other Ambulatory Visit (HOSPITAL_COMMUNITY): Payer: Self-pay

## 2021-08-29 ENCOUNTER — Other Ambulatory Visit (HOSPITAL_COMMUNITY): Payer: Self-pay

## 2021-09-21 DIAGNOSIS — Z0000 Encounter for general adult medical examination without abnormal findings: Secondary | ICD-10-CM | POA: Diagnosis not present

## 2021-09-21 DIAGNOSIS — Z13220 Encounter for screening for lipoid disorders: Secondary | ICD-10-CM | POA: Diagnosis not present

## 2021-09-26 DIAGNOSIS — Z6825 Body mass index (BMI) 25.0-25.9, adult: Secondary | ICD-10-CM | POA: Diagnosis not present

## 2021-09-26 DIAGNOSIS — Z0000 Encounter for general adult medical examination without abnormal findings: Secondary | ICD-10-CM | POA: Diagnosis not present

## 2021-11-10 ENCOUNTER — Other Ambulatory Visit (HOSPITAL_COMMUNITY): Payer: Self-pay

## 2021-12-14 DIAGNOSIS — H524 Presbyopia: Secondary | ICD-10-CM | POA: Diagnosis not present

## 2022-02-02 ENCOUNTER — Other Ambulatory Visit: Payer: Self-pay

## 2022-02-19 ENCOUNTER — Other Ambulatory Visit: Payer: Self-pay

## 2022-05-04 ENCOUNTER — Other Ambulatory Visit: Payer: Self-pay

## 2022-05-04 ENCOUNTER — Other Ambulatory Visit (HOSPITAL_COMMUNITY): Payer: Self-pay

## 2022-06-01 ENCOUNTER — Other Ambulatory Visit: Payer: Self-pay | Admitting: Family Medicine

## 2022-06-01 DIAGNOSIS — Z1231 Encounter for screening mammogram for malignant neoplasm of breast: Secondary | ICD-10-CM

## 2022-06-28 DIAGNOSIS — L720 Epidermal cyst: Secondary | ICD-10-CM | POA: Diagnosis not present

## 2022-06-28 DIAGNOSIS — D225 Melanocytic nevi of trunk: Secondary | ICD-10-CM | POA: Diagnosis not present

## 2022-07-02 ENCOUNTER — Ambulatory Visit
Admission: RE | Admit: 2022-07-02 | Discharge: 2022-07-02 | Disposition: A | Discharge status: Home / Self Care | Payer: 59 | Source: Ambulatory Visit

## 2022-07-02 DIAGNOSIS — Z1231 Encounter for screening mammogram for malignant neoplasm of breast: Secondary | ICD-10-CM

## 2022-07-20 ENCOUNTER — Other Ambulatory Visit (HOSPITAL_COMMUNITY): Payer: Self-pay

## 2022-07-20 DIAGNOSIS — Z3040 Encounter for surveillance of contraceptives, unspecified: Secondary | ICD-10-CM | POA: Diagnosis not present

## 2022-07-20 DIAGNOSIS — Z01419 Encounter for gynecological examination (general) (routine) without abnormal findings: Secondary | ICD-10-CM | POA: Diagnosis not present

## 2022-07-20 DIAGNOSIS — Z124 Encounter for screening for malignant neoplasm of cervix: Secondary | ICD-10-CM | POA: Diagnosis not present

## 2022-07-20 MED ORDER — SLYND 4 MG PO TABS
4.0000 mg | ORAL_TABLET | Freq: Every day | ORAL | 3 refills | Status: DC
  Filled 2022-08-13: qty 84, 84d supply, fill #0
  Filled 2022-11-08: qty 84, 84d supply, fill #1
  Filled 2023-01-31 – 2023-02-01 (×2): qty 84, 84d supply, fill #2
  Filled 2023-04-18: qty 84, 84d supply, fill #3
  Filled 2023-06-26 – 2023-06-28 (×2): qty 84, 84d supply, fill #4

## 2022-08-13 ENCOUNTER — Other Ambulatory Visit (HOSPITAL_COMMUNITY): Payer: Self-pay

## 2022-08-13 ENCOUNTER — Other Ambulatory Visit: Payer: Self-pay

## 2022-08-13 MED ORDER — BUPROPION HCL ER (XL) 150 MG PO TB24
300.0000 mg | ORAL_TABLET | Freq: Every day | ORAL | 3 refills | Status: DC
  Filled 2022-08-13 (×2): qty 180, 90d supply, fill #0
  Filled 2022-11-08: qty 180, 90d supply, fill #1
  Filled 2023-01-31 – 2023-02-01 (×2): qty 180, 90d supply, fill #2
  Filled 2023-06-26: qty 180, 90d supply, fill #3

## 2022-09-20 ENCOUNTER — Other Ambulatory Visit (HOSPITAL_COMMUNITY): Payer: Self-pay

## 2022-09-20 ENCOUNTER — Other Ambulatory Visit: Payer: Self-pay

## 2022-09-20 DIAGNOSIS — Z13220 Encounter for screening for lipoid disorders: Secondary | ICD-10-CM | POA: Diagnosis not present

## 2022-09-20 DIAGNOSIS — Z0000 Encounter for general adult medical examination without abnormal findings: Secondary | ICD-10-CM | POA: Diagnosis not present

## 2022-09-20 DIAGNOSIS — H524 Presbyopia: Secondary | ICD-10-CM | POA: Diagnosis not present

## 2022-09-20 MED ORDER — PREDNISOLONE ACETATE 1 % OP SUSP
OPHTHALMIC | 0 refills | Status: AC
  Filled 2022-09-20: qty 10, 14d supply, fill #0

## 2022-09-21 ENCOUNTER — Other Ambulatory Visit: Payer: Self-pay

## 2022-09-25 DIAGNOSIS — Z0000 Encounter for general adult medical examination without abnormal findings: Secondary | ICD-10-CM | POA: Diagnosis not present

## 2022-09-25 DIAGNOSIS — Z6826 Body mass index (BMI) 26.0-26.9, adult: Secondary | ICD-10-CM | POA: Diagnosis not present

## 2022-09-25 DIAGNOSIS — Z8249 Family history of ischemic heart disease and other diseases of the circulatory system: Secondary | ICD-10-CM | POA: Diagnosis not present

## 2022-11-09 ENCOUNTER — Other Ambulatory Visit: Payer: Self-pay

## 2022-11-13 ENCOUNTER — Other Ambulatory Visit: Payer: Self-pay

## 2023-01-31 ENCOUNTER — Other Ambulatory Visit (HOSPITAL_COMMUNITY): Payer: Self-pay

## 2023-02-01 ENCOUNTER — Other Ambulatory Visit (HOSPITAL_COMMUNITY): Payer: Self-pay

## 2023-02-01 ENCOUNTER — Other Ambulatory Visit: Payer: Self-pay

## 2023-02-16 IMAGING — MG MM DIGITAL SCREENING BILAT W/ TOMO AND CAD
8 series · 9 of 24 positions shown · non-contrast
Comparison: Previous exam(s).

CLINICAL DATA: Screening.

EXAM:
DIGITAL SCREENING BILATERAL MAMMOGRAM WITH TOMOSYNTHESIS AND CAD
TECHNIQUE: Bilateral screening digital craniocaudal and mediolateral oblique
mammograms were obtained. Bilateral screening digital breast
tomosynthesis was performed. The images were evaluated with
computer-aided detection.

[R MLO synth-2D]
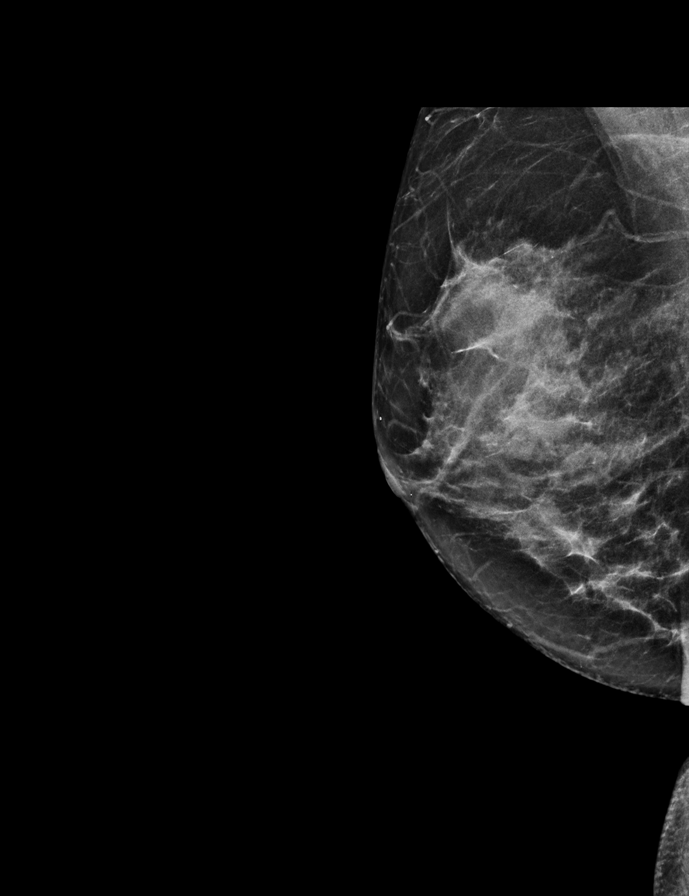

[L CC synth-2D]
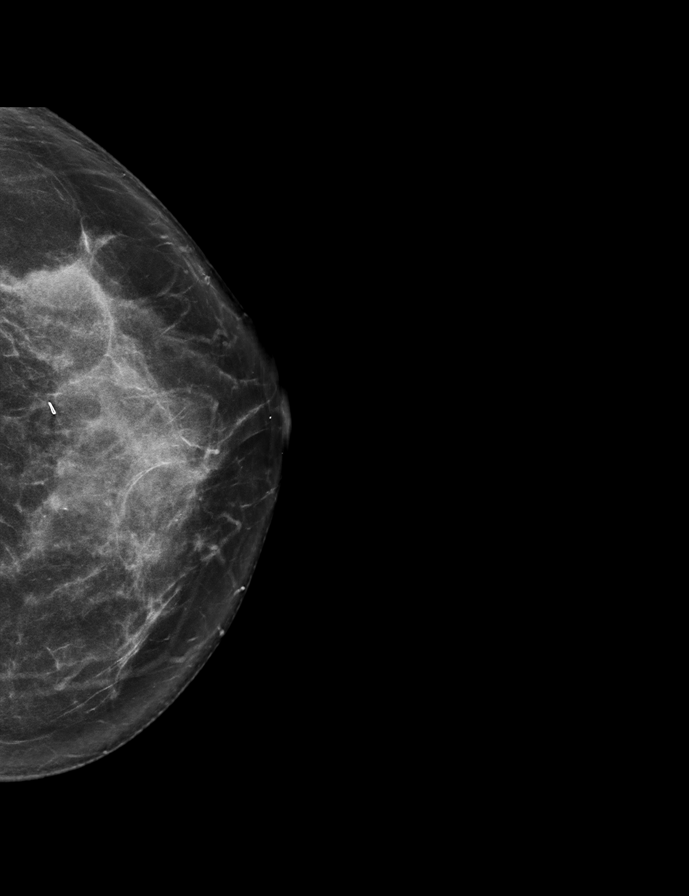

[L MLO synth-2D]
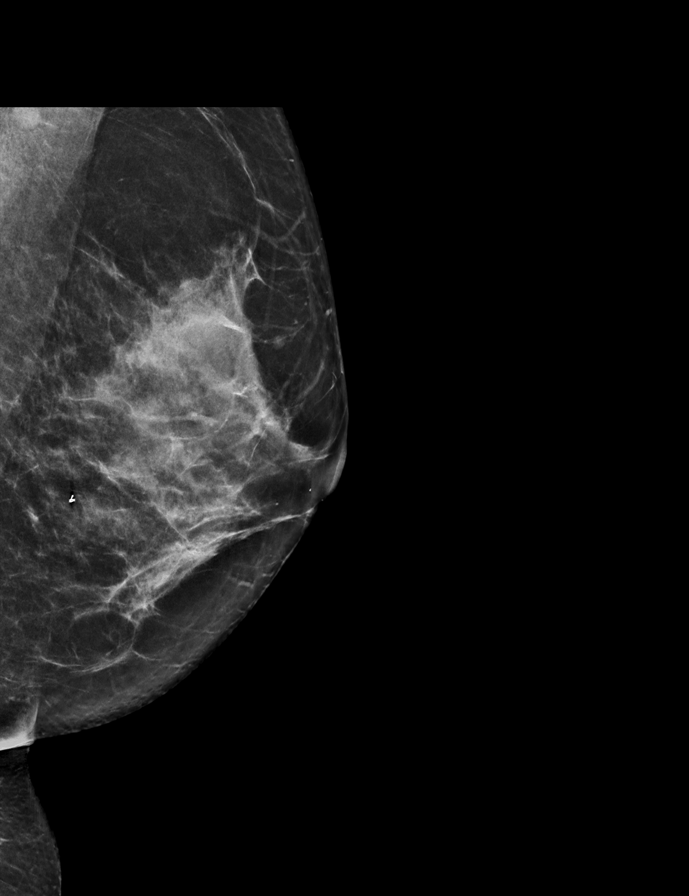

[R CC synth-2D]
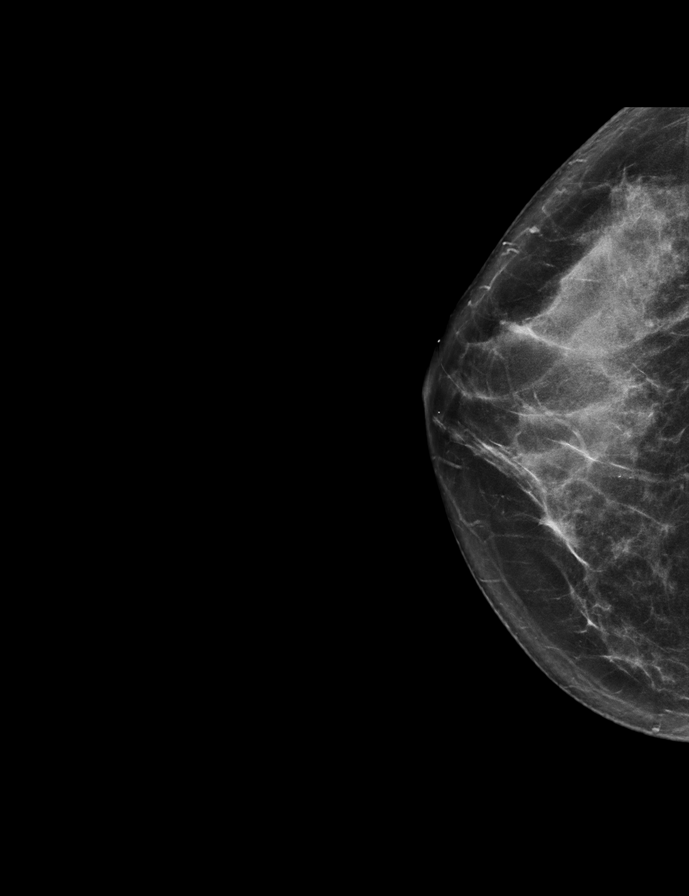

[L MLO tomo · 2 of 66 frames shown]
[frame 22/66]
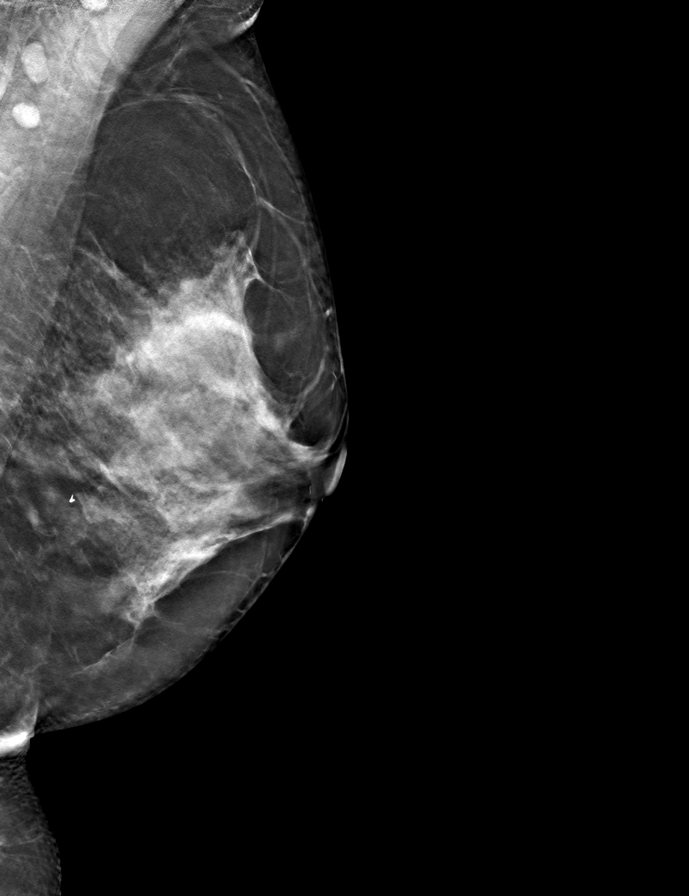
[frame 33/66]
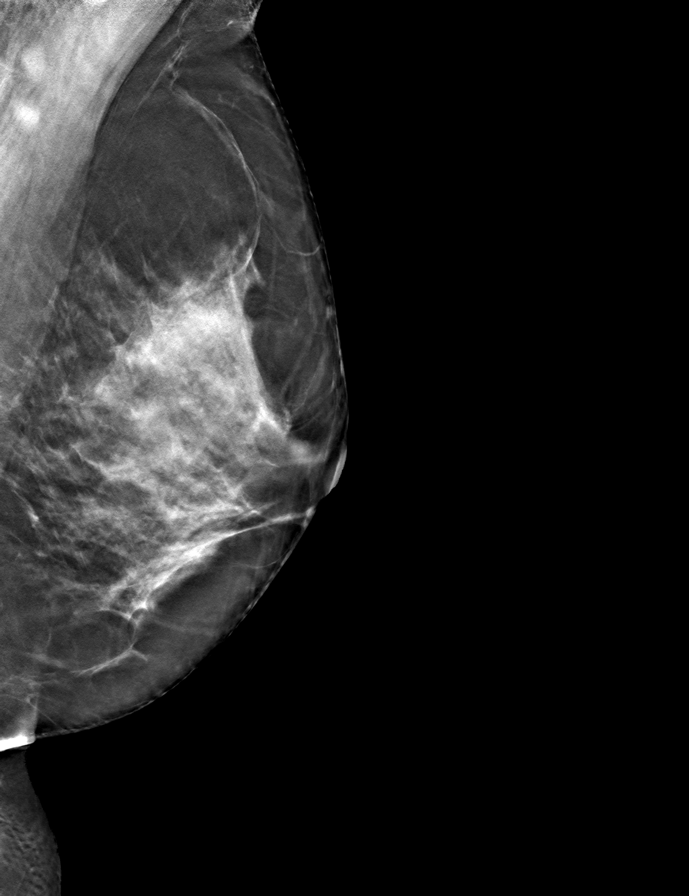

[R CC tomo · tomo slice 32/63.0]
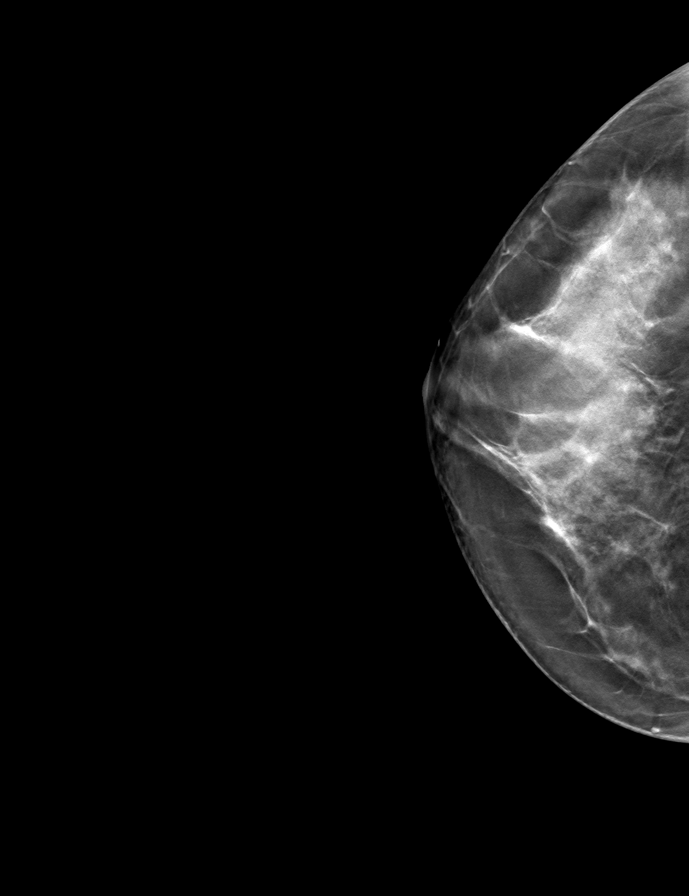

[L CC tomo · tomo slice 37/73.0]
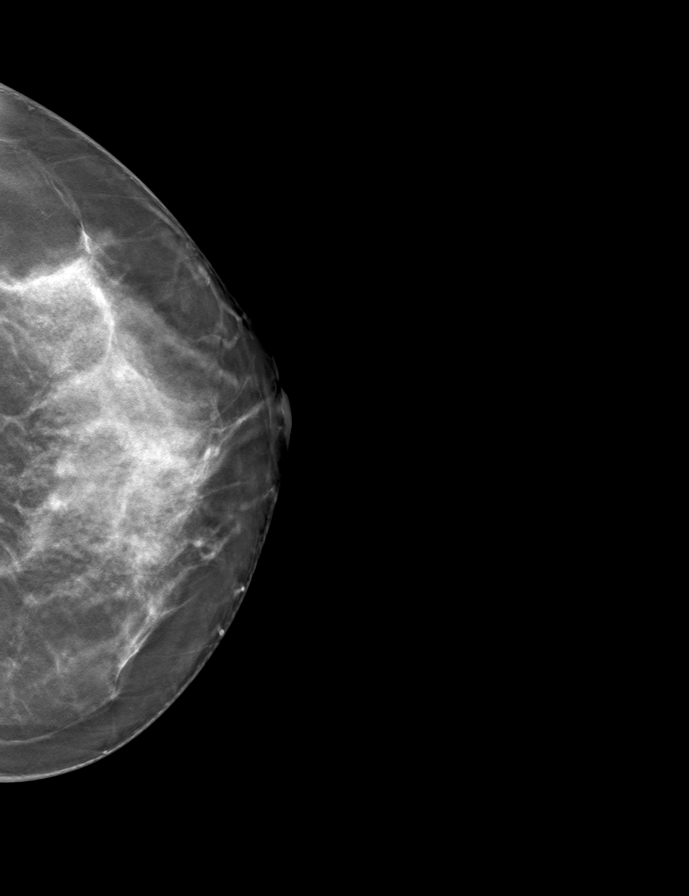

[R MLO tomo · tomo slice 29/57.0]
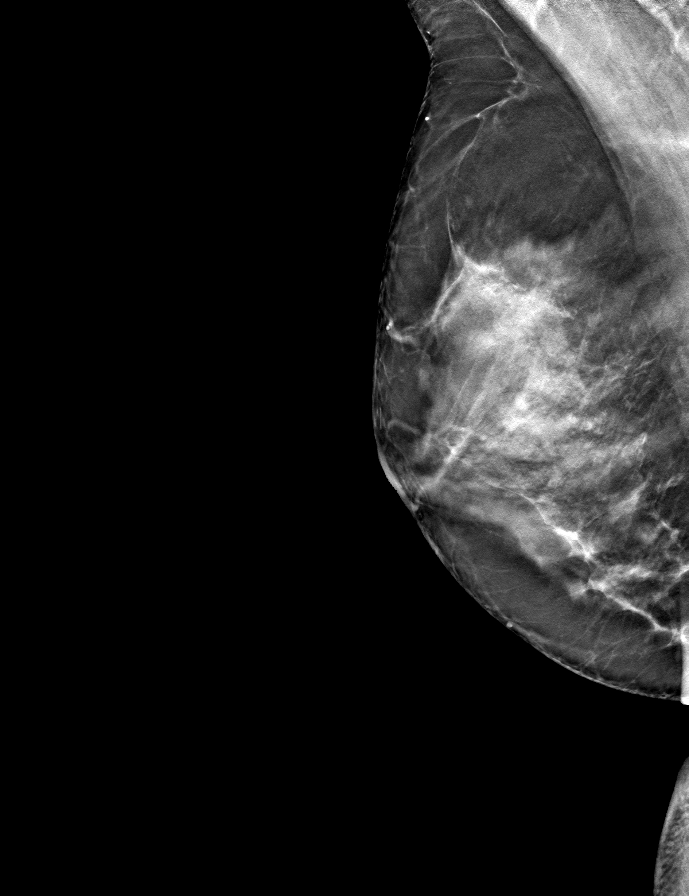

[9 of 24 positions shown; findings below may reference images not displayed]

ACR Breast Density Category c: The breast tissue is heterogeneously
dense, which may obscure small masses.
FINDINGS: There are no findings suspicious for malignancy.
IMPRESSION: No mammographic evidence of malignancy. A result letter of this
screening mammogram will be mailed directly to the patient.

RECOMMENDATION:
Screening mammogram in one year. (Code:Q3-W-BC3)

BI-RADS CATEGORY  1: Negative.

## 2023-04-12 DIAGNOSIS — D1801 Hemangioma of skin and subcutaneous tissue: Secondary | ICD-10-CM | POA: Diagnosis not present

## 2023-04-12 DIAGNOSIS — L720 Epidermal cyst: Secondary | ICD-10-CM | POA: Diagnosis not present

## 2023-04-12 DIAGNOSIS — D485 Neoplasm of uncertain behavior of skin: Secondary | ICD-10-CM | POA: Diagnosis not present

## 2023-04-12 DIAGNOSIS — D225 Melanocytic nevi of trunk: Secondary | ICD-10-CM | POA: Diagnosis not present

## 2023-04-12 DIAGNOSIS — L820 Inflamed seborrheic keratosis: Secondary | ICD-10-CM | POA: Diagnosis not present

## 2023-04-12 DIAGNOSIS — L814 Other melanin hyperpigmentation: Secondary | ICD-10-CM | POA: Diagnosis not present

## 2023-04-19 ENCOUNTER — Other Ambulatory Visit (HOSPITAL_COMMUNITY): Payer: Self-pay

## 2023-04-19 ENCOUNTER — Other Ambulatory Visit: Payer: Self-pay

## 2023-05-30 ENCOUNTER — Other Ambulatory Visit: Payer: Self-pay

## 2023-05-30 MED ORDER — SODIUM FLUORIDE 1.1 % DT CREA
TOPICAL_CREAM | DENTAL | 2 refills | Status: AC
  Filled 2023-05-30: qty 51, 28d supply, fill #0

## 2023-05-31 ENCOUNTER — Other Ambulatory Visit: Payer: Self-pay

## 2023-06-06 ENCOUNTER — Other Ambulatory Visit: Payer: Self-pay | Admitting: Obstetrics and Gynecology

## 2023-06-06 DIAGNOSIS — Z1231 Encounter for screening mammogram for malignant neoplasm of breast: Secondary | ICD-10-CM

## 2023-06-27 ENCOUNTER — Other Ambulatory Visit: Payer: Self-pay

## 2023-06-28 ENCOUNTER — Other Ambulatory Visit: Payer: Self-pay

## 2023-06-28 ENCOUNTER — Other Ambulatory Visit (HOSPITAL_COMMUNITY): Payer: Self-pay

## 2023-07-03 ENCOUNTER — Ambulatory Visit

## 2023-07-04 ENCOUNTER — Ambulatory Visit
Admission: RE | Admit: 2023-07-04 | Discharge: 2023-07-04 | Disposition: A | Discharge status: Home / Self Care | Source: Ambulatory Visit | Attending: Obstetrics and Gynecology | Admitting: Obstetrics and Gynecology

## 2023-07-04 DIAGNOSIS — Z1231 Encounter for screening mammogram for malignant neoplasm of breast: Secondary | ICD-10-CM

## 2023-09-12 ENCOUNTER — Other Ambulatory Visit (HOSPITAL_COMMUNITY): Payer: Self-pay

## 2023-09-12 MED ORDER — SLYND 4 MG PO TABS
4.0000 mg | ORAL_TABLET | Freq: Every day | ORAL | 0 refills | Status: DC
  Filled 2023-09-12: qty 112, 112d supply, fill #0
  Filled 2023-09-16: qty 84, 84d supply, fill #0
  Filled 2023-11-25: qty 28, 28d supply, fill #1
  Filled 2023-11-29: qty 28, 24d supply, fill #1

## 2023-09-16 ENCOUNTER — Other Ambulatory Visit: Payer: Self-pay

## 2023-09-19 DIAGNOSIS — Z0000 Encounter for general adult medical examination without abnormal findings: Secondary | ICD-10-CM | POA: Diagnosis not present

## 2023-09-19 DIAGNOSIS — Z13220 Encounter for screening for lipoid disorders: Secondary | ICD-10-CM | POA: Diagnosis not present

## 2023-09-24 DIAGNOSIS — Z6826 Body mass index (BMI) 26.0-26.9, adult: Secondary | ICD-10-CM | POA: Diagnosis not present

## 2023-09-24 DIAGNOSIS — Z0000 Encounter for general adult medical examination without abnormal findings: Secondary | ICD-10-CM | POA: Diagnosis not present

## 2023-10-10 DIAGNOSIS — H524 Presbyopia: Secondary | ICD-10-CM | POA: Diagnosis not present

## 2023-10-14 DIAGNOSIS — N951 Menopausal and female climacteric states: Secondary | ICD-10-CM | POA: Diagnosis not present

## 2023-10-14 DIAGNOSIS — Z01419 Encounter for gynecological examination (general) (routine) without abnormal findings: Secondary | ICD-10-CM | POA: Diagnosis not present

## 2023-10-14 DIAGNOSIS — Z124 Encounter for screening for malignant neoplasm of cervix: Secondary | ICD-10-CM | POA: Diagnosis not present

## 2023-11-25 ENCOUNTER — Other Ambulatory Visit: Payer: Self-pay

## 2023-11-26 ENCOUNTER — Other Ambulatory Visit (HOSPITAL_COMMUNITY): Payer: Self-pay

## 2023-11-29 ENCOUNTER — Other Ambulatory Visit (HOSPITAL_COMMUNITY): Payer: Self-pay

## 2023-11-29 ENCOUNTER — Other Ambulatory Visit: Payer: Self-pay

## 2023-12-02 ENCOUNTER — Other Ambulatory Visit: Payer: Self-pay

## 2023-12-12 ENCOUNTER — Other Ambulatory Visit (HOSPITAL_COMMUNITY): Payer: Self-pay

## 2023-12-18 ENCOUNTER — Other Ambulatory Visit (HOSPITAL_COMMUNITY): Payer: Self-pay

## 2023-12-19 ENCOUNTER — Other Ambulatory Visit: Payer: Self-pay

## 2023-12-19 ENCOUNTER — Other Ambulatory Visit (HOSPITAL_COMMUNITY): Payer: Self-pay

## 2023-12-19 MED ORDER — SLYND 4 MG PO TABS
1.0000 | ORAL_TABLET | Freq: Every day | ORAL | 4 refills | Status: AC
  Filled 2023-12-19: qty 112, 84d supply, fill #0
  Filled 2023-12-20: qty 84, 72d supply, fill #0

## 2023-12-19 MED ORDER — BUPROPION HCL ER (XL) 150 MG PO TB24
300.0000 mg | ORAL_TABLET | Freq: Every day | ORAL | 3 refills | Status: AC
  Filled 2023-12-19: qty 180, 90d supply, fill #0

## 2023-12-20 ENCOUNTER — Other Ambulatory Visit: Payer: Self-pay

## 2023-12-20 ENCOUNTER — Other Ambulatory Visit (HOSPITAL_COMMUNITY): Payer: Self-pay

## 2024-01-31 ENCOUNTER — Other Ambulatory Visit (HOSPITAL_BASED_OUTPATIENT_CLINIC_OR_DEPARTMENT_OTHER): Payer: Self-pay

## 2024-01-31 MED ORDER — SHINGRIX 50 MCG/0.5ML IM SUSR
INTRAMUSCULAR | 0 refills | Status: AC
  Filled 2024-01-31: qty 1, 1d supply, fill #0
